# Patient Record
Sex: Female | Born: 1940 | Race: White | Hispanic: No | Marital: Married | Smoking: Former smoker
Health system: Southern US, Community
[De-identification: ages and names within clinical notes are randomized; demographics above are authoritative.]

## PROBLEM LIST (undated history)

## (undated) DIAGNOSIS — K2270 Barrett's esophagus without dysplasia: Secondary | ICD-10-CM

## (undated) DIAGNOSIS — E039 Hypothyroidism, unspecified: Secondary | ICD-10-CM

## (undated) DIAGNOSIS — Z8542 Personal history of malignant neoplasm of other parts of uterus: Secondary | ICD-10-CM

## (undated) DIAGNOSIS — K5792 Diverticulitis of intestine, part unspecified, without perforation or abscess without bleeding: Secondary | ICD-10-CM

## (undated) DIAGNOSIS — J45909 Unspecified asthma, uncomplicated: Secondary | ICD-10-CM

## (undated) HISTORY — PX: MANDIBLE FRACTURE SURGERY: SHX706

## (undated) HISTORY — PX: 25 GAUGE PARS PLANA VITRECTOMY WITH 20 GAUGE MVR PORT FOR MACULAR HOLE: SHX6096

## (undated) HISTORY — PX: LAPAROSCOPIC OOPHERECTOMY: SHX6507

## (undated) HISTORY — PX: ABDOMINAL HYSTERECTOMY: SHX81

---

## 2006-07-24 ENCOUNTER — Ambulatory Visit: Payer: Self-pay | Admitting: Family Medicine

## 2006-08-14 ENCOUNTER — Ambulatory Visit: Payer: Self-pay | Admitting: *Deleted

## 2007-07-27 ENCOUNTER — Ambulatory Visit: Payer: Self-pay | Admitting: Family Medicine

## 2007-08-12 ENCOUNTER — Ambulatory Visit: Payer: Self-pay | Admitting: Unknown Physician Specialty

## 2008-03-07 ENCOUNTER — Ambulatory Visit: Payer: Self-pay | Admitting: Family Medicine

## 2009-01-25 ENCOUNTER — Ambulatory Visit: Payer: Self-pay | Admitting: Family Medicine

## 2009-03-29 ENCOUNTER — Ambulatory Visit: Payer: Self-pay | Admitting: Family Medicine

## 2010-03-16 ENCOUNTER — Ambulatory Visit: Payer: Self-pay | Admitting: Gynecologic Oncology

## 2010-03-27 ENCOUNTER — Ambulatory Visit: Payer: Self-pay | Admitting: Unknown Physician Specialty

## 2010-04-10 ENCOUNTER — Ambulatory Visit: Payer: Self-pay | Admitting: Gynecologic Oncology

## 2010-04-12 ENCOUNTER — Emergency Department: Payer: Self-pay | Admitting: Emergency Medicine

## 2010-04-13 ENCOUNTER — Inpatient Hospital Stay: Payer: Self-pay | Admitting: Internal Medicine

## 2010-05-02 ENCOUNTER — Ambulatory Visit: Payer: Self-pay | Admitting: Unknown Physician Specialty

## 2010-07-16 ENCOUNTER — Ambulatory Visit: Payer: Self-pay | Admitting: Gynecologic Oncology

## 2010-08-14 ENCOUNTER — Ambulatory Visit: Payer: Self-pay | Admitting: Gynecologic Oncology

## 2010-08-16 ENCOUNTER — Ambulatory Visit: Payer: Self-pay | Admitting: Gynecologic Oncology

## 2011-01-08 ENCOUNTER — Ambulatory Visit: Payer: Self-pay | Admitting: Obstetrics & Gynecology

## 2011-01-17 ENCOUNTER — Ambulatory Visit: Payer: Self-pay | Admitting: Obstetrics & Gynecology

## 2011-01-18 LAB — PATHOLOGY REPORT

## 2011-03-26 ENCOUNTER — Ambulatory Visit: Payer: Self-pay | Admitting: Family Medicine

## 2011-11-04 ENCOUNTER — Ambulatory Visit: Payer: Self-pay | Admitting: Family Medicine

## 2011-12-24 ENCOUNTER — Ambulatory Visit: Payer: Self-pay | Admitting: Family Medicine

## 2012-07-07 ENCOUNTER — Ambulatory Visit: Payer: Self-pay | Admitting: Family Medicine

## 2013-10-15 ENCOUNTER — Ambulatory Visit: Payer: Self-pay | Admitting: Physician Assistant

## 2013-12-10 ENCOUNTER — Ambulatory Visit: Payer: Self-pay | Admitting: Family Medicine

## 2014-08-15 ENCOUNTER — Ambulatory Visit: Payer: Self-pay | Admitting: Family Medicine

## 2014-12-16 HISTORY — PX: BREAST BIOPSY: SHX20

## 2017-09-03 ENCOUNTER — Ambulatory Visit (INDEPENDENT_AMBULATORY_CARE_PROVIDER_SITE_OTHER): Payer: Medicare Other

## 2017-09-03 ENCOUNTER — Ambulatory Visit
Admission: EM | Admit: 2017-09-03 | Discharge: 2017-09-03 | Disposition: A | Discharge status: Home / Self Care | Payer: Medicare Other | Attending: Family Medicine | Admitting: Family Medicine

## 2017-09-03 DIAGNOSIS — S9002XA Contusion of left ankle, initial encounter: Secondary | ICD-10-CM

## 2017-09-03 DIAGNOSIS — S81812A Laceration without foreign body, left lower leg, initial encounter: Secondary | ICD-10-CM | POA: Diagnosis not present

## 2017-09-03 DIAGNOSIS — S7002XA Contusion of left hip, initial encounter: Secondary | ICD-10-CM | POA: Diagnosis not present

## 2017-09-03 DIAGNOSIS — W19XXXA Unspecified fall, initial encounter: Secondary | ICD-10-CM | POA: Diagnosis not present

## 2017-09-03 HISTORY — DX: Hypothyroidism, unspecified: E03.9

## 2017-09-03 HISTORY — DX: Personal history of malignant neoplasm of other parts of uterus: Z85.42

## 2017-09-03 HISTORY — DX: Unspecified asthma, uncomplicated: J45.909

## 2017-09-03 HISTORY — DX: Diverticulitis of intestine, part unspecified, without perforation or abscess without bleeding: K57.92

## 2017-09-03 HISTORY — DX: Barrett's esophagus without dysplasia: K22.70

## 2017-09-03 MED ORDER — ACETAMINOPHEN 500 MG PO TABS
1000.0000 mg | ORAL_TABLET | Freq: Once | ORAL | Status: AC
  Administered 2017-09-03: 1000 mg via ORAL

## 2017-09-03 NOTE — ED Provider Notes (Addendum)
[facility]    CSN: [id number] Arrival date & time: 09/03/17  1925     History   Chief Complaint Chief Complaint  Patient presents with  . Fall    HPI Dawn Pham is a 76 y.o. female.   Patient is a 76 year old white female who fell about 2 hours ago. She is coming out of her shower when she slipped. She wanted getting abrasion on her right lower leg she had a extensive skin tear on her left lower leg and also pain in her left hip as well. She is able to a late with difficulty. Past smoker history she has a history of asthma parasite. Esophagitis diverticulitis history of uterine cancer and hypothyroidism. He's had eye surgery abdominal hysterectomy breast biopsy laparoscopic oophorectomy and she's had minimal surgery as well. No pertinent family medical history relevant to today's visit. She does not smoke. She is allergic to multiple antibiotics such as penicillin sulfur neomycin Flagyl morphine NSAIDs and Ceftin   The history is provided by the patient. No language interpreter was used.  Fall  This is a new problem. The current episode started 1 to 2 hours ago. The problem has not changed since onset.Pertinent negatives include no chest pain, no abdominal pain, no headaches and no shortness of breath. Nothing aggravates the symptoms. Nothing relieves the symptoms. The treatment provided moderate relief.  Hip Pain  This is a new problem. The current episode started 1 to 2 hours ago. Pertinent negatives include no chest pain, no abdominal pain, no headaches and no shortness of breath.    Past Medical History:  Diagnosis Date  . Asthma   . Barrett's esophagus   . Diverticulitis   . History of uterine cancer   . Hypothyroid     There are no active problems to display for this patient.   Past Surgical History:  Procedure Laterality Date  . 25 GAUGE PARS PLANA VITRECTOMY WITH 20 GAUGE MVR PORT FOR MACULAR HOLE    . ABDOMINAL HYSTERECTOMY    . BREAST BIOPSY  Left 2016  . LAPAROSCOPIC OOPHERECTOMY    . MANDIBLE FRACTURE SURGERY      OB History    No data available       Home Medications    Prior to Admission medications   Medication Sig Start Date End Date Taking? Authorizing Provider  levothyroxine (SYNTHROID, LEVOTHROID) 75 MCG tablet Take 75 mcg by mouth daily before breakfast.   Yes [provider]  montelukast (SINGULAIR) 10 MG tablet Take 10 mg by mouth at bedtime.   Yes [provider]    Family History History reviewed. No pertinent family history.  Social History Social History  Substance Use Topics  . Smoking status: Never Smoker  . Smokeless tobacco: Never Used  . Alcohol use No     Allergies   Ceftin [cefuroxime axetil]; Chondroitin sulfate a; Erythromycin; Metronidazole; Morphine and related; Neomycin; Nsaids; Penicillins; and Sulfa antibiotics   Review of Systems Review of Systems  Respiratory: Negative for shortness of breath.   Cardiovascular: Negative for chest pain.  Gastrointestinal: Negative for abdominal pain.  Musculoskeletal: Positive for gait problem, joint swelling and myalgias.  Skin: Positive for wound.  Neurological: Negative for headaches.     Physical Exam Triage Vital Signs ED Triage Vitals  Enc Vitals Group     BP 09/03/17 2025 (!) 153/66     Pulse Rate 09/03/17 2025 68     Resp 09/03/17 2025 18  Temp 09/03/17 2025 98.8 F (37.1 C)     Temp Source 09/03/17 2025 Oral     SpO2 09/03/17 2025 97 %     Weight 09/03/17 2026 155 lb (70.3 kg)     Height 09/03/17 2026 5' 9.5" (1.765 m)     Head Circumference --      Peak Flow --      Pain Score 09/03/17 2026 8     Pain Loc --      Pain Edu? --      Excl. in GC? --    No data found.   Updated Vital Signs BP (!) 153/66 (BP Location: Left Arm)   Pulse 68   Temp 98.8 F (37.1 C) (Oral)   Resp 18   Ht 5' 9.5" (1.765 m)   Wt 155 lb (70.3 kg)   SpO2 97%   BMI 22.56 kg/m   Visual Acuity Right Eye  Distance:   Left Eye Distance:   Bilateral Distance:    Right Eye Near:   Left Eye Near:    Bilateral Near:     Physical Exam  Constitutional: She is oriented to person, place, and time. She appears well-developed and well-nourished. She appears distressed.  HENT:  Head: Normocephalic and atraumatic.  Right Ear: External ear normal.  Left Ear: External ear normal.  Eyes: Pupils are equal, round, and reactive to light.  Neck: Normal range of motion. Neck supple.  Pulmonary/Chest: Effort normal.  Musculoskeletal: She exhibits tenderness.       Left hip: She exhibits tenderness. She exhibits no bony tenderness, no deformity and no laceration.       Left ankle: She exhibits swelling. She exhibits no laceration. Tenderness.       Left lower leg: She exhibits tenderness, swelling and laceration.       Feet:  Tenderness over the left ankle over the left lower leg she has a skin tear of between 15 and 18 cm. She also has tenderness along the left lower leg and she has tenderness over the left hip as well  Neurological: She is alert and oriented to person, place, and time.  Skin: Skin is warm.  Psychiatric: She has a normal mood and affect.  Vitals reviewed.    UC Treatments / Results  Labs (all labs ordered are listed, but only abnormal results are displayed) Labs Reviewed - No data to display  EKG  EKG Interpretation None       Radiology Dg Ankle Complete Left  Result Date: 09/03/2017 CLINICAL DATA:  Pain following fall EXAM: LEFT ANKLE COMPLETE - 3+ VIEW COMPARISON:  October 15, 2013 FINDINGS: Frontal, oblique, and lateral views were obtained. There is no acute fracture or joint effusion. There is joint space narrowing laterally. No erosive change. Ankle mortise appears intact. There is postoperative change in area of old fracture of the first metatarsal. There is remodeling in this area. No erosive change. IMPRESSION: No acute fracture. Old trauma, incompletely visualized,  with surgical fixation first metatarsal, unchanged. Osteoarthritic change laterally. Ankle mortise appears intact. Electronically Signed   By: Bretta Bang III M.D.   On: 09/03/2017 21:38   Dg Hip Unilat With Pelvis 2-3 Views Left  Result Date: 09/03/2017 CLINICAL DATA:  Pain following fall EXAM: DG HIP (WITH OR WITHOUT PELVIS) 2-3V LEFT COMPARISON:  None. FINDINGS: Frontal pelvis as well as frontal and lateral left hip images were obtained. There is no fracture or dislocation. There is mild symmetric narrowing of both hip joints.  No erosive change. Sacroiliac joints appear unremarkable bilaterally. 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 No fracture or dislocation. Electronically Signed   By: Bretta Bang III M.D.   On: 09/03/2017 21:39    Procedures .Marland KitchenLaceration Repair Date/Time: 09/03/2017 9:15 PM Performed by: Hassan Rowan Authorized by: Hassan Rowan   Consent:    Consent obtained:  Verbal Anesthesia (see MAR for exact dosages):    Anesthesia method:  None Laceration details:    Location:  Leg   Leg location:  L lower leg   Length (cm):  17 Repair type:    Repair type:  Simple Pre-procedure details:    Preparation:  Patient was prepped and draped in usual sterile fashion Treatment:    Area cleansed with:  Shur-Clens Skin repair:    Repair method:  Tissue adhesive Approximation:    Approximation:  Close   Vermilion border: well-aligned   Post-procedure details:    Dressing:  Non-adherent dressing   Patient tolerance of procedure:  Tolerated well, no immediate complications Comments:     The skin tag was pulled back to meet his approximate edges 3 Dermabond used to close the wound. Patient tolerated closure well.   (including critical care time)  Medications Ordered in UC Medications  acetaminophen (TYLENOL) tablet 1,000 mg (1,000 mg Oral Given 09/03/17 2037)     Initial Impression / Assessment and Plan / UC Course  I have reviewed  the triage vital signs and the nursing notes.  Pertinent labs & imaging results that were available during my care of the patient were reviewed by me and considered in my medical decision making (see chart for details).     The skin tear was repaired plan will be to follow-up in 1-2 weeks with PCP for dilation. We'll x-ray the left lower leg and left hip as well due to the contusion and abrasion on the right lower leg is tender but don't fill and cross x-raying at this time.  Both x-rays were negative patient instruction take Tylenol since she has some mid-allergies especially to NSAIDs  Final Clinical Impressions(s) / UC Diagnoses   Final diagnoses:  Fall  Fall, initial encounter  Contusion of left hip, initial encounter  Contusion of left ankle, initial encounter  Skin tear of left lower leg without complication, initial encounter    New Prescriptions Discharge Medication List as of 09/03/2017  9:47 PM     Note: This dictation was prepared with Dragon dictation along with smaller phrase technology. Any transcriptional errors that result from this process are unintentional.  Controlled Substance Prescriptions [place] Controlled Substance Registry consulted? Not Applicable   Hassan Rowan, MD 09/03/17 2118    Hassan Rowan, MD 09/03/17 2157

## 2017-09-03 NOTE — ED Triage Notes (Signed)
Patient complains of a fall that occurred tonight while getting out of the shower. Patient states that she has a left leg wound.

## 2019-06-12 IMAGING — CR DG ANKLE COMPLETE 3+V*L*
3 series · 3 of 3 positions shown · non-contrast
Comparison: October 15, 2013

CLINICAL DATA: Pain following fall

EXAM:
LEFT ANKLE COMPLETE - 3+ VIEW

[ankle ap]
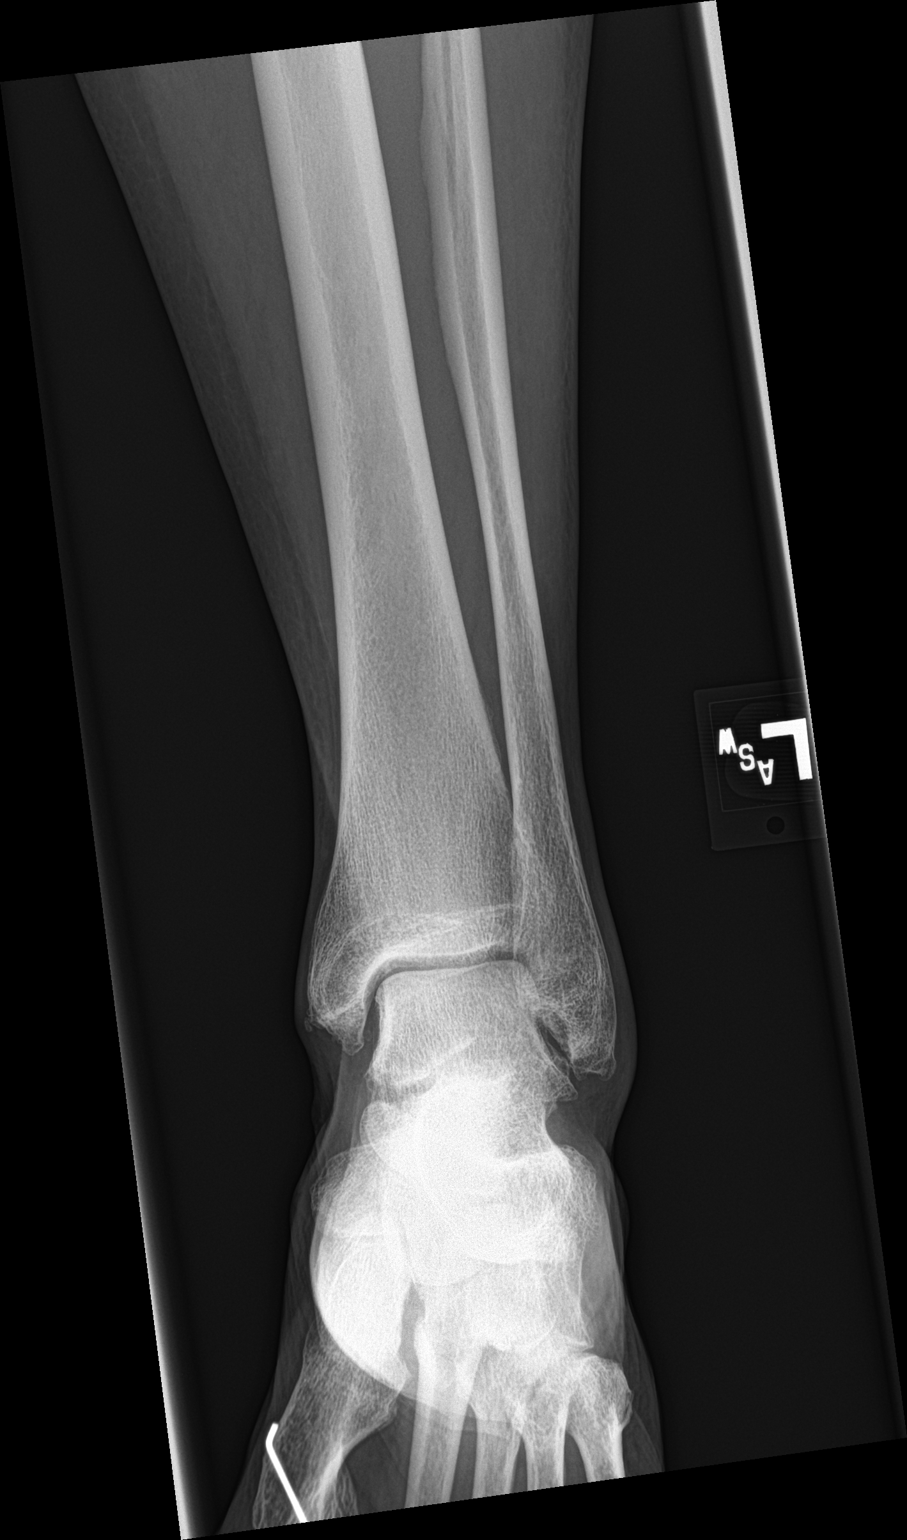

[ankle obl]
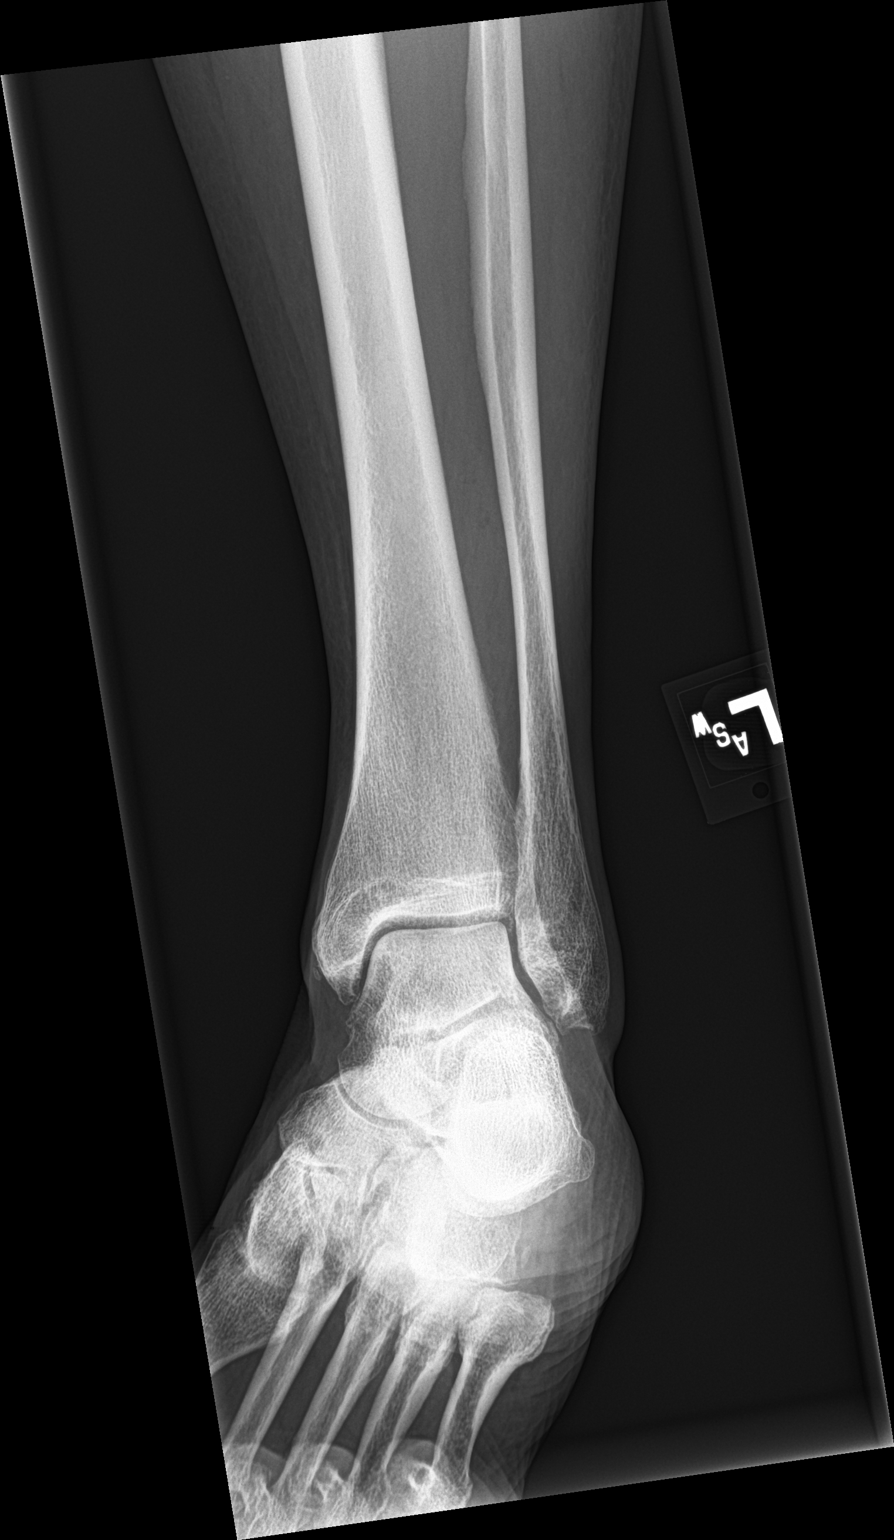

[ankle lat]
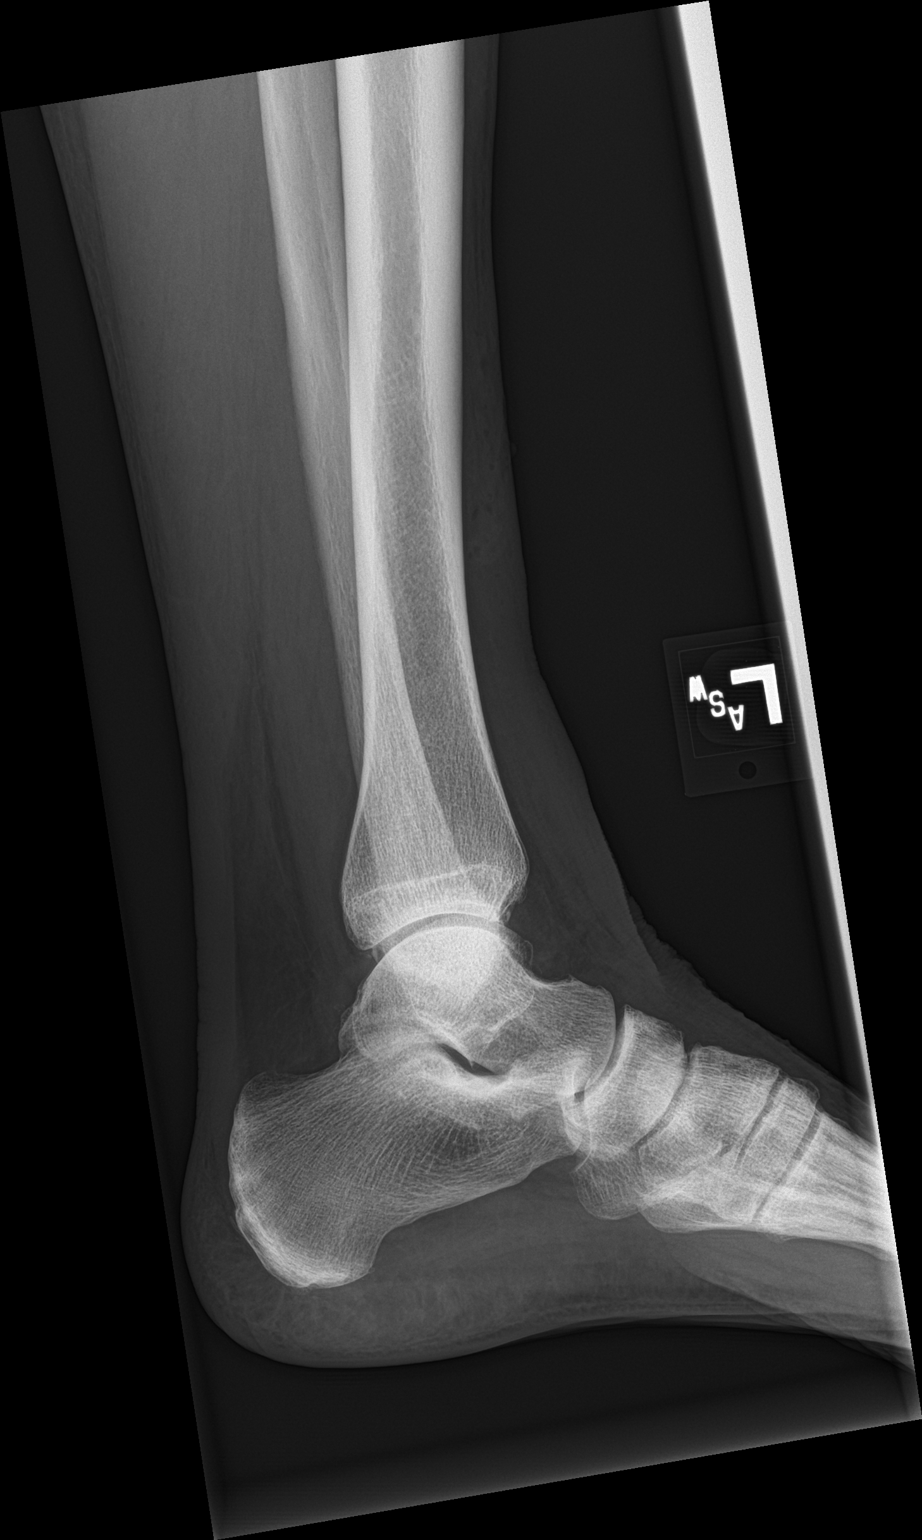

[3 of 3 positions shown; findings below may reference images not displayed]

FINDINGS: Frontal, oblique, and lateral views were obtained. There is no acute
fracture or joint effusion. There is joint space narrowing
laterally. No erosive change. Ankle mortise appears intact. There is
postoperative change in area of old fracture of the first
metatarsal. There is remodeling in this area. No erosive change.
IMPRESSION: No acute fracture. Old trauma, incompletely visualized, with
surgical fixation first metatarsal, unchanged. Osteoarthritic change
laterally. Ankle mortise appears intact.

## 2021-01-15 ENCOUNTER — Ambulatory Visit
Admission: EM | Admit: 2021-01-15 | Discharge: 2021-01-15 | Disposition: A | Discharge status: Home / Self Care | Payer: Medicare Other | Attending: Physician Assistant | Admitting: Physician Assistant

## 2021-01-15 ENCOUNTER — Encounter: Payer: Self-pay | Admitting: Emergency Medicine

## 2021-01-15 ENCOUNTER — Other Ambulatory Visit: Payer: Self-pay

## 2021-01-15 DIAGNOSIS — R300 Dysuria: Secondary | ICD-10-CM | POA: Diagnosis not present

## 2021-01-15 LAB — URINALYSIS, COMPLETE (UACMP) WITH MICROSCOPIC
Bilirubin Urine: NEGATIVE
Glucose, UA: NEGATIVE mg/dL
Hgb urine dipstick: NEGATIVE
Ketones, ur: NEGATIVE mg/dL
Leukocytes,Ua: NEGATIVE
Nitrite: NEGATIVE
Protein, ur: NEGATIVE mg/dL
RBC / HPF: NONE SEEN RBC/hpf (ref 0–5)
Specific Gravity, Urine: 1.015 (ref 1.005–1.030)
pH: 7 (ref 5.0–8.0)

## 2021-01-15 MED ORDER — PHENAZOPYRIDINE HCL 200 MG PO TABS: 200.0000 mg | ORAL_TABLET | Freq: Three times a day (TID) | ORAL | 0 refills | Status: AC

## 2021-01-15 MED ORDER — FOSFOMYCIN TROMETHAMINE 3 G PO PACK: 3.0000 g | PACK | Freq: Once | ORAL | 0 refills | Status: AC

## 2021-01-15 MED ORDER — CIPROFLOXACIN HCL 250 MG PO TABS: 250.0000 mg | ORAL_TABLET | Freq: Two times a day (BID) | ORAL | 0 refills | Status: DC

## 2021-01-15 NOTE — Discharge Instructions (Addendum)
Your urine test is not really consistent with a UTI today. We will send it for culture and if the culture is positive for bacteria you can fill the antibiotic prescription today given you. At this time I will send Pyridium which is a medication to help with the burning symptom. You should also increase your rest and fluids. Follow-up with [place] as needed.

## 2021-01-15 NOTE — ED Triage Notes (Signed)
Pt c/o dysuria. Started about 2 days ago. Denies fever, lower back pain or pelvic pain.

## 2021-01-15 NOTE — ED Provider Notes (Addendum)
[facility]    CSN: [id number] Arrival date & time: 01/15/21  1003      History   Chief Complaint Chief Complaint  Patient presents with  . Dysuria    HPI Dawn Pham is a 80 y.o. female presenting for dysuria x 2 days. She denies urinary frequency urgency. Denies fever, fatigue, chills, abdominal pain, back pain, hematuria or vaginal discharge. Patient states she has had UTIs in the past and believes her symptoms are consistent with UTI. She says she is going out of town for 2 weeks and does not want to be without medication if she needs it. Has not taken any over-the-counter medication for symptoms. No other concerns.  HPI  Past Medical History:  Diagnosis Date  . Asthma   . Barrett's esophagus   . Diverticulitis   . History of uterine cancer   . Hypothyroid     There are no problems to display for this patient.   Past Surgical History:  Procedure Laterality Date  . 25 GAUGE PARS PLANA VITRECTOMY WITH 20 GAUGE MVR PORT FOR MACULAR HOLE    . ABDOMINAL HYSTERECTOMY    . BREAST BIOPSY Left 2016  . LAPAROSCOPIC OOPHERECTOMY    . MANDIBLE FRACTURE SURGERY      OB History   No obstetric history on file.      Home Medications    Prior to Admission medications   Medication Sig Start Date End Date Taking? Authorizing Provider  fosfomycin (MONUROL) 3 g PACK Take 3 g by mouth once for 1 dose. 01/15/21 01/15/21 Yes Shirlee Latch, PA-C  levothyroxine (SYNTHROID, LEVOTHROID) 75 MCG tablet Take 75 mcg by mouth daily before breakfast.   Yes [provider]  montelukast (SINGULAIR) 10 MG tablet Take 10 mg by mouth at bedtime.   Yes [provider]  phenazopyridine (PYRIDIUM) 200 MG tablet Take 1 tablet (200 mg total) by mouth 3 (three) times daily for 2 days. 01/15/21 01/17/21 Yes Shirlee Latch, PA-C    Family History No family history on file.  Social History Social History   Tobacco Use  . Smoking status: Never Smoker  .  Smokeless tobacco: Never Used  Substance Use Topics  . Alcohol use: No  . Drug use: No     Allergies   Ceftin [cefuroxime axetil], Chondroitin sulfate a, Ciprofloxacin, Erythromycin, Macrobid [nitrofurantoin], Metronidazole, Morphine and related, Neomycin, Nsaids, Penicillins, and Sulfa antibiotics   Review of Systems Review of Systems  Constitutional: Negative for chills, fatigue and fever.  Gastrointestinal: Negative for abdominal pain, diarrhea, nausea and vomiting.  Genitourinary: Positive for dysuria. Negative for decreased urine volume, flank pain, frequency, hematuria, pelvic pain, urgency, vaginal bleeding, vaginal discharge and vaginal pain.  Musculoskeletal: Negative for back pain.  Skin: Negative for rash.     Physical Exam Triage Vital Signs ED Triage Vitals  Enc Vitals Group     BP 01/15/21 1048 130/83     Pulse Rate 01/15/21 1048 68     Resp 01/15/21 1048 18     Temp 01/15/21 1048 98.1 F (36.7 C)     Temp Source 01/15/21 1048 Oral     SpO2 01/15/21 1048 99 %     Weight 01/15/21 1046 154 lb 15.7 oz (70.3 kg)     Height 01/15/21 1046 5' 9.5" (1.765 m)     Head Circumference --      Peak Flow --      Pain Score 01/15/21 1046 0  Pain Loc --      Pain Edu? --      Excl. in GC? --    No data found.  Updated Vital Signs BP 130/83 (BP Location: Right Arm)   Pulse 68   Temp 98.1 F (36.7 C) (Oral)   Resp 18   Ht 5' 9.5" (1.765 m)   Wt 154 lb 15.7 oz (70.3 kg)   SpO2 99%   BMI 22.56 kg/m       Physical Exam Vitals and nursing note reviewed.  Constitutional:      General: She is not in acute distress.    Appearance: Normal appearance. She is not ill-appearing or toxic-appearing.  HENT:     Head: Normocephalic and atraumatic.  Eyes:     General: No scleral icterus.       Right eye: No discharge.        Left eye: No discharge.     Conjunctiva/sclera: Conjunctivae normal.  Cardiovascular:     Rate and Rhythm: Normal rate and regular rhythm.      Heart sounds: Normal heart sounds.  Pulmonary:     Effort: Pulmonary effort is normal. No respiratory distress.     Breath sounds: Normal breath sounds. No wheezing, rhonchi or rales.  Abdominal:     Palpations: Abdomen is soft.     Tenderness: There is no abdominal tenderness. There is no right CVA tenderness or left CVA tenderness.  Musculoskeletal:     Cervical back: Neck supple.  Skin:    General: Skin is dry.  Neurological:     General: No focal deficit present.     Mental Status: She is alert. Mental status is at baseline.     Motor: No weakness.     Gait: Gait normal.  Psychiatric:        Mood and Affect: Mood normal.        Behavior: Behavior normal.        Thought Content: Thought content normal.      UC Treatments / Results  Labs (all labs ordered are listed, but only abnormal results are displayed) Labs Reviewed  URINALYSIS, COMPLETE (UACMP) WITH MICROSCOPIC - Abnormal; Notable for the following components:      Result Value   Bacteria, UA FEW (*)    All other components within normal limits  URINE CULTURE    EKG   Radiology No results found.  Procedures Procedures (including critical care time)  Medications Ordered in UC Medications - No data to display  Initial Impression / Assessment and Plan / UC Course  I have reviewed the triage vital signs and the nursing notes.  Pertinent labs & imaging results that were available during my care of the patient were reviewed by me and considered in my medical decision making (see chart for details).   80 year old female with 2-day history of dysuria. All vital signs are normal and stable and exam is benign. Urinalysis is normal. Negative for blood, leukocytes, and nitrites. Advised patient that urinalysis is not really consistent with a urinary tract infection. Advised waiting for the urine culture result before starting antibiotics. Since she is going out of town I did print a prescription for fosfomycin to be  filled if the urine culture is positive for bacteria. Originally prescribe Cipro but patient said she has allergy. Advised increased rest and fluids. I did send Pyridium for the symptoms. Advised to follow-up with our clinic as needed.   Final Clinical Impressions(s) / UC Diagnoses   Final  diagnoses:  Dysuria     Discharge Instructions     Your urine test is not really consistent with a UTI today. We will send it for culture and if the culture is positive for bacteria you can fill the antibiotic prescription today given you. At this time I will send Pyridium which is a medication to help with the burning symptom. You should also increase your rest and fluids. Follow-up with [place] as needed.    ED Prescriptions    Medication Sig Dispense Auth. Provider   phenazopyridine (PYRIDIUM) 200 MG tablet Take 1 tablet (200 mg total) by mouth 3 (three) times daily for 2 days. 6 tablet Eusebio Friendly B, PA-C   ciprofloxacin (CIPRO) 250 MG tablet  (Status: Discontinued) Take 1 tablet (250 mg total) by mouth every 12 (twelve) hours for 5 days. 10 tablet Eusebio Friendly B, PA-C   fosfomycin (MONUROL) 3 g PACK Take 3 g by mouth once for 1 dose. 3 g Gareth Morgan     PDMP not reviewed this encounter.   Shirlee Latch, PA-C 01/15/21 1219    Eusebio Friendly B, PA-C 01/15/21 1226

## 2021-01-17 LAB — URINE CULTURE

## 2022-11-12 ENCOUNTER — Other Ambulatory Visit: Payer: Self-pay | Admitting: Specialist

## 2022-11-12 DIAGNOSIS — Z1231 Encounter for screening mammogram for malignant neoplasm of breast: Secondary | ICD-10-CM

## 2022-11-13 ENCOUNTER — Ambulatory Visit: Payer: Medicare Other | Attending: Specialist | Admitting: Physical Therapy

## 2022-11-13 ENCOUNTER — Encounter: Payer: Self-pay | Admitting: Physical Therapy

## 2022-11-13 DIAGNOSIS — R278 Other lack of coordination: Secondary | ICD-10-CM | POA: Insufficient documentation

## 2022-11-13 DIAGNOSIS — M6281 Muscle weakness (generalized): Secondary | ICD-10-CM | POA: Insufficient documentation

## 2022-11-13 NOTE — Therapy (Signed)
  OUTPATIENT PHYSICAL THERAPY FEMALE PELVIC CONSULTATION   Patient Name: Dawn Pham MRN: [id number] DOB:March 24, 1941, 81 y.o., female Today's Date: 11/13/2022  END OF SESSION:  PT End of Session - 11/13/22 1123     Visit Number 0    Number of Visits --    Date for PT Re-Evaluation --    Authorization Type --    PT Start Time 1125    PT Stop Time 1155    PT Time Calculation (min) 30 min    Activity Tolerance --    Behavior During Therapy --             Past Medical History:  Diagnosis Date   Asthma    Barrett's esophagus    Diverticulitis    History of uterine cancer    Hypothyroid    Past Surgical History:  Procedure Laterality Date   25 GAUGE PARS PLANA VITRECTOMY WITH 20 GAUGE MVR PORT FOR MACULAR HOLE     ABDOMINAL HYSTERECTOMY     BREAST BIOPSY Left 2016   LAPAROSCOPIC OOPHERECTOMY     MANDIBLE FRACTURE SURGERY     There are no problems to display for this patient.   PCP: Jenell Milliner, MD  REFERRING PROVIDER: Karie Georges Pap, MD  REFERRING DIAG: M62.89 (ICD-10-CM) - Other specified disorders of muscle  THERAPY DIAG:  Muscle weakness (generalized)  Other lack of coordination  Rationale for Evaluation and Treatment: Rehabilitation  PRECAUTIONS: None  WEIGHT BEARING RESTRICTIONS: No  FALLS:  Has patient fallen in last 6 months? No  SUBJECTIVE:                                                                                                                                                                                           CHIEF COMPLAINT: Patient states that with recent tenacious UTI she had increased pelvic pain (rectal, vaginal). Patient became constipated during the process and notes increased concern due to comorbidities. Patient notes an increase in tension because of the infection. Patient was found via MRI to have mild rectocele. Patient denies any symptoms at this time and is not sure she needs physical therapy.    After  discussion with patient, we agreed to postpone initial evaluation as symptoms are currently nonexistent. Patient was encouraged to continue with good water intake, low back physical therapy exercises, and gentle stretching of the hips and low back for improved PFM relaxation. Patient encouraged to reach back out if any symptoms emerge and we will evaluate at that time.      Sheria Lang PT, DPT [phone]  11/13/2022, 12:02 PM

## 2022-11-20 ENCOUNTER — Encounter: Payer: Medicare Other | Admitting: Physical Therapy

## 2022-11-27 ENCOUNTER — Encounter: Payer: Medicare Other | Admitting: Physical Therapy

## 2022-12-04 ENCOUNTER — Encounter: Payer: Medicare Other | Admitting: Physical Therapy

## 2022-12-11 ENCOUNTER — Encounter: Payer: Medicare Other | Admitting: Physical Therapy

## 2023-01-10 ENCOUNTER — Encounter: Payer: Self-pay | Admitting: Obstetrics and Gynecology

## 2023-01-10 ENCOUNTER — Ambulatory Visit: Payer: Medicare Other | Admitting: Obstetrics and Gynecology

## 2023-01-10 VITALS — BP 117/75 | HR 77 | Ht 64.75 in | Wt 158.0 lb

## 2023-01-10 DIAGNOSIS — R350 Frequency of micturition: Secondary | ICD-10-CM | POA: Diagnosis not present

## 2023-01-10 DIAGNOSIS — N952 Postmenopausal atrophic vaginitis: Secondary | ICD-10-CM

## 2023-01-10 DIAGNOSIS — N816 Rectocele: Secondary | ICD-10-CM | POA: Diagnosis not present

## 2023-01-10 DIAGNOSIS — R82998 Other abnormal findings in urine: Secondary | ICD-10-CM

## 2023-01-10 DIAGNOSIS — N3941 Urge incontinence: Secondary | ICD-10-CM | POA: Diagnosis not present

## 2023-01-10 DIAGNOSIS — K5904 Chronic idiopathic constipation: Secondary | ICD-10-CM

## 2023-01-10 LAB — POCT URINALYSIS DIPSTICK
Bilirubin, UA: NEGATIVE
Blood, UA: NEGATIVE
Glucose, UA: NEGATIVE
Ketones, UA: NEGATIVE
Nitrite, UA: NEGATIVE
Protein, UA: NEGATIVE
Spec Grav, UA: 1.02 (ref 1.010–1.025)
Urobilinogen, UA: 0.2 E.U./dL
pH, UA: 6.5 (ref 5.0–8.0)

## 2023-01-10 MED ORDER — ESTRADIOL 0.1 MG/GM VA CREA: TOPICAL_CREAM | VAGINAL | 11 refills | Status: AC
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
## 2023-01-10 NOTE — Patient Instructions (Addendum)
Use estrogen cream 0.5g (pea sized amount) nightly for two weeks then twice a week after for prevention of urinary tract infections and vaginal dryness.   You have a stage 2 (out of 4) prolapse.  We discussed the fact that it is not life threatening but there are several treatment options. For treatment of pelvic organ prolapse, we discussed options for management including expectant management, conservative management, and surgical management, such as Kegels, a pessary, pelvic floor physical therapy, and specific surgical procedures.

## 2023-01-14 LAB — URINE CULTURE

## 2023-01-14 MED ORDER — FOSFOMYCIN TROMETHAMINE 3 G PO PACK: 3.0000 g | PACK | Freq: Once | ORAL | 0 refills | Status: AC

## 2023-01-14 NOTE — Addendum Note (Signed)
Addended by: Jaquita Folds on: 01/14/2023 01:05 PM   Modules accepted: Orders

## 2023-01-14 NOTE — Progress Notes (Signed)
Attempted to contact patient. Message to RTC was left with spouse.

## 2023-01-15 NOTE — Progress Notes (Signed)
Patient has been notified

## 2023-10-03 ENCOUNTER — Other Ambulatory Visit: Payer: Self-pay | Admitting: Family Medicine

## 2023-10-03 DIAGNOSIS — R1011 Right upper quadrant pain: Secondary | ICD-10-CM

## 2023-10-03 DIAGNOSIS — R197 Diarrhea, unspecified: Secondary | ICD-10-CM

## 2023-10-13 ENCOUNTER — Ambulatory Visit
Admission: RE | Admit: 2023-10-13 | Discharge: 2023-10-13 | Disposition: A | Discharge status: Home / Self Care | Payer: Medicare Other | Source: Ambulatory Visit | Attending: Family Medicine | Admitting: Family Medicine

## 2023-10-13 DIAGNOSIS — R197 Diarrhea, unspecified: Secondary | ICD-10-CM | POA: Insufficient documentation

## 2023-10-13 DIAGNOSIS — R1011 Right upper quadrant pain: Secondary | ICD-10-CM | POA: Diagnosis present
